# Patient Record
Sex: Female | Born: 1990 | Race: White | Hispanic: No | Marital: Single | Smoking: Current every day smoker
Health system: Southern US, Community
[De-identification: ages and names within clinical notes are randomized; demographics above are authoritative.]

---

## 2012-03-25 ENCOUNTER — Emergency Department (HOSPITAL_COMMUNITY)
Admission: EM | Admit: 2012-03-25 | Discharge: 2012-03-25 | Disposition: A | Discharge status: Home / Self Care | Payer: BC Managed Care – PPO | Source: Home / Self Care | Attending: Family Medicine | Admitting: Family Medicine

## 2012-03-25 ENCOUNTER — Encounter (HOSPITAL_COMMUNITY): Payer: Self-pay | Admitting: *Deleted

## 2012-03-25 DIAGNOSIS — B719 Cestode infection, unspecified: Secondary | ICD-10-CM

## 2012-03-25 MED ORDER — PRAZIQUANTEL 600 MG PO TABS: 1800.0000 mg | ORAL_TABLET | Freq: Two times a day (BID) | ORAL | Status: AC

## 2012-03-25 NOTE — ED Provider Notes (Signed)
History     CSN: [id number]  Arrival date & time 03/25/12  1117   First MD Initiated Contact with Patient 03/25/12 1120      Chief Complaint  Patient presents with  . Tinea    (Consider location/radiation/quality/duration/timing/severity/associated sxs/prior treatment) Patient is a 21 y.o. female presenting with cramps. The history is provided by the patient.  Abdominal Cramping The primary symptoms of the illness do not include fever. Primary symptoms comment: noticed a tapeworm in stool this am. The current episode started 3 to 5 hours ago. The onset of the illness was sudden. The problem has been gradually worsening.  Associated symptoms comments: Cramping in abd for past couple days, , new puppy also with worm infestation.Marland Kitchen    History reviewed. No pertinent past medical history.  History reviewed. No pertinent past surgical history.  Family History  Problem Relation Age of Onset  . Family history unknown: Yes    History  Substance Use Topics  . Smoking status: Current Every Day Smoker -- 1.0 packs/day    Types: Cigarettes  . Smokeless tobacco: Not on file  . Alcohol Use: Yes     socially    OB History    Grav Para Term Preterm Abortions TAB SAB Ect Mult Living                  Review of Systems  Constitutional: Negative for fever.  Gastrointestinal: Negative.     Allergies  Meloxicam and Penicillins  Home Medications   Current Outpatient Rx  Name Route Sig Dispense Refill  . PRAZIQUANTEL 600 MG PO TABS Oral Take 3 tablets (1,800 mg total) by mouth 2 (two) times daily with a meal. 6 tablet 0    BP 132/88  Pulse 90  Temp 98.1 F (36.7 C)  Resp 16  SpO2 98%  LMP 03/18/2012  Physical Exam  Nursing note and vitals reviewed. Constitutional: She is oriented to person, place, and time. She appears well-developed and well-nourished.  Abdominal: Soft. Bowel sounds are normal. She exhibits no distension and no mass. There is no tenderness. There is no  rebound and no guarding.  Neurological: She is alert and oriented to person, place, and time.  Skin: Skin is warm and dry.    ED Course  Procedures (including critical care time)  Labs Reviewed - No data to display No results found.   1. Tapeworm       MDM  Parasite lesion noted in stool spec obtained.        Linna Hoff, MD 03/25/12 [phone]

## 2012-03-25 NOTE — ED Notes (Signed)
Pt reports : " I shit out a tape worm this morning and I have been unsatisfied when eating, constipated, unusually loud stomach noises recently" pt reports recently getting new puppy and eating [place] food - no traveling

## 2012-03-29 LAB — OVA AND PARASITE EXAMINATION

## 2013-03-09 ENCOUNTER — Emergency Department (HOSPITAL_COMMUNITY)
Admission: EM | Admit: 2013-03-09 | Discharge: 2013-03-09 | Disposition: A | Discharge status: Home / Self Care | Payer: Self-pay

## 2018-04-14 ENCOUNTER — Other Ambulatory Visit (HOSPITAL_COMMUNITY): Payer: Self-pay | Admitting: Orthopedic Surgery

## 2018-04-14 DIAGNOSIS — M25562 Pain in left knee: Secondary | ICD-10-CM

## 2019-05-03 ENCOUNTER — Other Ambulatory Visit (HOSPITAL_COMMUNITY): Payer: Self-pay | Admitting: *Deleted

## 2019-05-03 DIAGNOSIS — N644 Mastodynia: Secondary | ICD-10-CM

## 2019-05-03 DIAGNOSIS — R2232 Localized swelling, mass and lump, left upper limb: Secondary | ICD-10-CM

## 2019-05-10 ENCOUNTER — Ambulatory Visit
Admission: RE | Admit: 2019-05-10 | Discharge: 2019-05-10 | Disposition: A | Discharge status: Home / Self Care | Payer: No Typology Code available for payment source | Source: Ambulatory Visit | Attending: Obstetrics and Gynecology | Admitting: Obstetrics and Gynecology

## 2019-05-10 ENCOUNTER — Other Ambulatory Visit: Payer: Self-pay

## 2019-05-10 ENCOUNTER — Ambulatory Visit (HOSPITAL_COMMUNITY)
Admission: RE | Admit: 2019-05-10 | Discharge: 2019-05-10 | Disposition: A | Discharge status: Home / Self Care | Payer: Self-pay | Source: Ambulatory Visit | Attending: Obstetrics and Gynecology | Admitting: Obstetrics and Gynecology

## 2019-05-10 ENCOUNTER — Encounter (HOSPITAL_COMMUNITY): Payer: Self-pay

## 2019-05-10 DIAGNOSIS — R2232 Localized swelling, mass and lump, left upper limb: Secondary | ICD-10-CM

## 2019-05-10 DIAGNOSIS — N644 Mastodynia: Secondary | ICD-10-CM

## 2019-05-10 DIAGNOSIS — Z1239 Encounter for other screening for malignant neoplasm of breast: Secondary | ICD-10-CM

## 2019-05-10 NOTE — Patient Instructions (Addendum)
Explained breast self awareness with [name]. Patient did not need a Pap smear today due to last Pap smear was in July 2020. Let her know [facility] will cover Pap smears every 3 years unless has a history of abnormal Pap smears. Referred patient to the [name] for a Left breast ultrasound. Appointment scheduled for Wednesday, May 10, 2019 at 1340. Patient aware of appointment and will be there. Discussed smoking cessation with patient. Referred to the [facility] Quitline and gave resources to the free smoking cessation classes at [facility]. Nina Erickson verbalized understanding.  Kareena Arrambide, Arvil Chaco, RN 10:49 AM

## 2019-05-10 NOTE — Progress Notes (Addendum)
Complaints of a left outer breat lump and pain when touched x 3 months. Patient rates the pain at a 3 out of 10.  Pap Smear: Pap smear not completed today. Last Pap smear was in July 2020 at [facility] and normal per patient. Per patient has no history of an abnormal Pap smear. No Pap smear results are in Epic.  Physical exam: Breasts Breasts symmetrical. No skin abnormalities bilateral breasts. No nipple retraction bilateral breasts. No nipple discharge bilateral breasts. No lymphadenopathy. No lumps palpated bilateral breasts. Unable to palpate a lump in patients area of concern within the left outer breast. Complaints of left outer breast pain on exam. Referred patient to the [facility] for a Left breast ultrasound. Appointment scheduled for Wednesday, May 10, 2019 at 1340.        Pelvic/Bimanual No Pap smear completed today since last Pap smear was in July 2020 per patient. Pap smear not indicated per [facility]CP guidelines.   Smoking History: Patient is a current smoker. Discussed smoking cessation with patient. Referred to the [facility] Quitline and gave resources to the free smoking cessation classes at [facility].  Patient Navigation: Patient education provided. Access to services provided for patient through [facility] program.   Breast and Cervical Cancer Risk Assessment: Patient has a family history of her maternal grandmother having breast cancer. Patient has no known genetic mutations or history of radiation treatment to the chest before age 29. Patient has no history of cervical dysplasia, immunocompromised, or DES exposure in-utero. Breast cancer risk assessment completed. No breast cancer risk calculated due to patient is less than 58 years old.

## 2019-05-10 NOTE — Addendum Note (Signed)
Encounter addended by: Loletta Parish, RN on: 05/10/2019 11:38 AM  Actions taken: Clinical Note Signed

## 2019-07-13 ENCOUNTER — Encounter (HOSPITAL_COMMUNITY): Payer: Self-pay

## 2020-10-31 ENCOUNTER — Other Ambulatory Visit: Payer: Self-pay

## 2020-10-31 ENCOUNTER — Emergency Department (HOSPITAL_COMMUNITY)
Admission: EM | Admit: 2020-10-31 | Discharge: 2020-10-31 | Disposition: A | Discharge status: Home / Self Care | Payer: Worker's Compensation | Attending: Emergency Medicine | Admitting: Emergency Medicine

## 2020-10-31 DIAGNOSIS — Y990 Civilian activity done for income or pay: Secondary | ICD-10-CM | POA: Diagnosis not present

## 2020-10-31 DIAGNOSIS — Y93E5 Activity, floor mopping and cleaning: Secondary | ICD-10-CM | POA: Insufficient documentation

## 2020-10-31 DIAGNOSIS — W25XXXA Contact with sharp glass, initial encounter: Secondary | ICD-10-CM | POA: Insufficient documentation

## 2020-10-31 DIAGNOSIS — S79921A Unspecified injury of right thigh, initial encounter: Secondary | ICD-10-CM | POA: Diagnosis present

## 2020-10-31 DIAGNOSIS — F17210 Nicotine dependence, cigarettes, uncomplicated: Secondary | ICD-10-CM | POA: Diagnosis not present

## 2020-10-31 DIAGNOSIS — S71111A Laceration without foreign body, right thigh, initial encounter: Secondary | ICD-10-CM | POA: Insufficient documentation

## 2020-10-31 MED ORDER — LIDOCAINE HCL (PF) 1 % IJ SOLN
5.0000 mL | Freq: Once | INTRAMUSCULAR | Status: AC
  Administered 2020-10-31: 5 mL
  Filled 2020-10-31: qty 30

## 2020-10-31 MED ORDER — LIDOCAINE HCL (PF) 1 % IJ SOLN: 30.0000 mL | Freq: Once | INTRAMUSCULAR | Status: DC

## 2020-10-31 NOTE — ED Provider Notes (Signed)
Nina Erickson Provider Note   CSN: [id number] Arrival date & time: 10/31/20  2041     History Chief Complaint  Patient presents with  . Laceration    Laceration to right thigh, bleeding controled     Nina Erickson is a 30 y.o. female.  The history is provided by the patient.  Laceration Location:  Leg Leg laceration location:  R upper leg Length:  4 cm Depth:  Cutaneous Quality: straight   Bleeding: controlled   Time since incident:  2 hours Laceration mechanism:  Broken glass Pain details:    Quality:  Aching   Severity:  Mild   Timing:  Constant   Progression:  Improving Foreign body present:  No foreign bodies Tetanus status:  Up to date Associated symptoms: no fever, no numbness, no redness, no swelling and no streaking        No past medical history on file.  Patient Active Problem List   Diagnosis Date Noted  . Screening breast examination 05/10/2019  . Breast pain, left 05/10/2019    No past surgical history on file.   OB History    Gravida  0   Para  0   Term  0   Preterm  0   AB  0   Living  0     SAB  0   IAB  0   Ectopic  0   Multiple  0   Live Births  0           Family History  Problem Relation Age of Onset  . Breast cancer Maternal Grandmother     Social History   Tobacco Use  . Smoking status: Current Every Day Smoker    Packs/day: 1.00    Types: Cigarettes  . Smokeless tobacco: Never Used  Vaping Use  . Vaping Use: Never used  Substance Use Topics  . Alcohol use: Yes    Comment: socially  . Drug use: No    Home Medications Prior to Admission medications   Medication Sig Start Date End Date Taking? Authorizing Provider  praziquantel (BILTRICIDE) 600 MG tablet Take 3 tablets (1,800 mg total) by mouth 2 (two) times daily with a meal. 03/25/12   Erickson, Nina Skye, MD    Allergies    Codeine, Meloxicam, and Penicillins  Review of Systems   Review of Systems   Constitutional: Negative for fever.  Skin: Positive for wound.    Physical Exam Updated Vital Signs BP 119/75 (BP Location: Right Arm)   Pulse 82   Temp 99.4 F (37.4 C) (Oral)   Resp 18   Ht 5\' 9"  (1.753 m)   Wt 77.1 kg   LMP 10/14/2020 (Approximate)   SpO2 100%   BMI 25.10 kg/m   Physical Exam Vitals and nursing note reviewed.  Constitutional:      General: She is not in acute distress.    Appearance: She is well-developed. She is not diaphoretic.  HENT:     Head: Normocephalic and atraumatic.  Eyes:     General: No scleral icterus.    Conjunctiva/sclera: Conjunctivae normal.  Cardiovascular:     Rate and Rhythm: Normal rate and regular rhythm.     Heart sounds: Normal heart sounds. No murmur heard. No friction rub. No gallop.   Pulmonary:     Effort: Pulmonary effort is normal. No respiratory distress.     Breath sounds: Normal breath sounds.  Abdominal:     General: Bowel sounds are  normal. There is no distension.     Palpations: Abdomen is soft. There is no mass.     Tenderness: There is no abdominal tenderness. There is no guarding.  Musculoskeletal:     Cervical back: Normal range of motion.  Skin:    General: Skin is warm and dry.     Comments: 4 cm laceration R thigh  Neurological:     Mental Status: She is alert and oriented to person, place, and time.  Psychiatric:        Behavior: Behavior normal.     ED Results / Procedures / Treatments   Labs (all labs ordered are listed, but only abnormal results are displayed) Labs Reviewed - No data to display  EKG None  Radiology No results found.  Procedures .Marland KitchenLaceration Repair  Date/Time: 10/31/2020 11:18 PM Performed by: Arthor Captain, PA-C Authorized by: Arthor Captain, PA-C   Consent:    Consent obtained:  Verbal   Consent given by:  Patient   Risks discussed:  Infection, need for additional repair, pain, poor cosmetic result and poor wound healing   Alternatives discussed:  No  treatment and delayed treatment Universal protocol:    Procedure explained and questions answered to patient or proxy's satisfaction: yes     Relevant documents present and verified: yes     Test results available: yes     Imaging studies available: yes     Required blood products, implants, devices, and special equipment available: yes     Site/side marked: yes     Immediately prior to procedure, a time out was called: yes     Patient identity confirmed:  Verbally with patient Anesthesia:    Anesthesia method:  Local infiltration   Local anesthetic:  Lidocaine 1% w/o epi Laceration details:    Location:  Leg   Leg location:  R upper leg   Length (cm):  4   Depth (mm):  3 Pre-procedure details:    Preparation:  Patient was prepped and draped in usual sterile fashion Exploration:    Wound exploration: wound explored through full range of motion and entire depth of wound visualized   Treatment:    Area cleansed with:  Povidone-iodine   Amount of cleaning:  Standard   Irrigation solution:  Sterile saline   Irrigation method:  Pressure wash Skin repair:    Repair method:  Sutures   Suture size:  5-0   Wound skin closure material used: vicryl rapide.   Suture technique:  Running locked   Number of sutures:  5 Approximation:    Approximation:  Close Repair type:    Repair type:  Simple Post-procedure details:    Dressing:  Sterile dressing   Procedure completion:  Tolerated well, no immediate complications     Medications Ordered in ED Medications  lidocaine (PF) (XYLOCAINE) 1 % injection 5 mL (has no administration in time range)    ED Course  I have reviewed the triage vital signs and the nursing notes.  Pertinent labs & imaging results that were available during my care of the patient were reviewed by me and considered in my medical decision making (see chart for details).    MDM Rules/Calculators/A&P                          Nina Erickson is a 30 y.o. female who  presents to ED for laceration of thigh (r). Wound thoroughly cleaned in ED today. Wound explored and bottom  of wound seen in a bloodless field. Laceration repaired as dictated above. Patient counseled on home wound care. Patient was urged to return to the Emergency Department for worsening pain, swelling, expanding erythema especially if it streaks away from the affected area, fever, or for any additional concerns. Patient verbalized understanding. All questions answered.  Final Clinical Impression(s) / ED Diagnoses Final diagnoses:  None    Rx / DC Orders ED Discharge Orders    None       Arthor Captain, PA-C 10/31/20 2320    Virgina Norfolk, DO 10/31/20 2327

## 2020-10-31 NOTE — ED Triage Notes (Signed)
30 yo female presents to ED status post lac to right thigh. Pt states while working she had a wine glass that she was cleaning that fell out of her hand. Pt states she attempted to catch glass from falling to the ground by pinning it between cabinet and her thigh causing a lac to her thigh by broken stem of wine glass. Pt states she cleaned the area with water. Pt denies any other complaints at this time.

## 2020-10-31 NOTE — Discharge Instructions (Addendum)
WOUND CARE If your stitches have not dissolved, please have your stitches/staples removed in 7 days or sooner if you have concerns. You may do this at any available urgent care or at your primary care doctor's office.  Keep area clean and dry for 24 hours. Do not remove bandage, if applied.  After 24 hours, remove bandage and wash wound gently with mild soap and warm water. Reapply a new bandage after cleaning wound, if directed.  Continue daily cleansing with soap and water until stitches/staples are removed.  Do not apply any ointments or creams to the wound while stitches/staples are in place, as this may cause delayed healing.  Seek medical careif you experience any of the following signs of infection: Swelling, redness, pus drainage, streaking, fever >101.0 F  Seek care if you experience excessive bleeding that does not stop after 15-20 minutes of constant, firm pressure.

## 2021-06-25 IMAGING — US US BREAST*L* LIMITED INC AXILLA
1 series · 6 of 6 positions shown · non-contrast
Comparison: Previous exam(s).

CLINICAL DATA: Patient complains focal pain in the upper-outer
quadrant of the left breast and left axilla.

EXAM:
ULTRASOUND OF THE LEFT BREAST

[Series 1: us breast*left* limited inc axilla · 0.06mm/px · 6 of 6 slices shown]
[im 1/6]
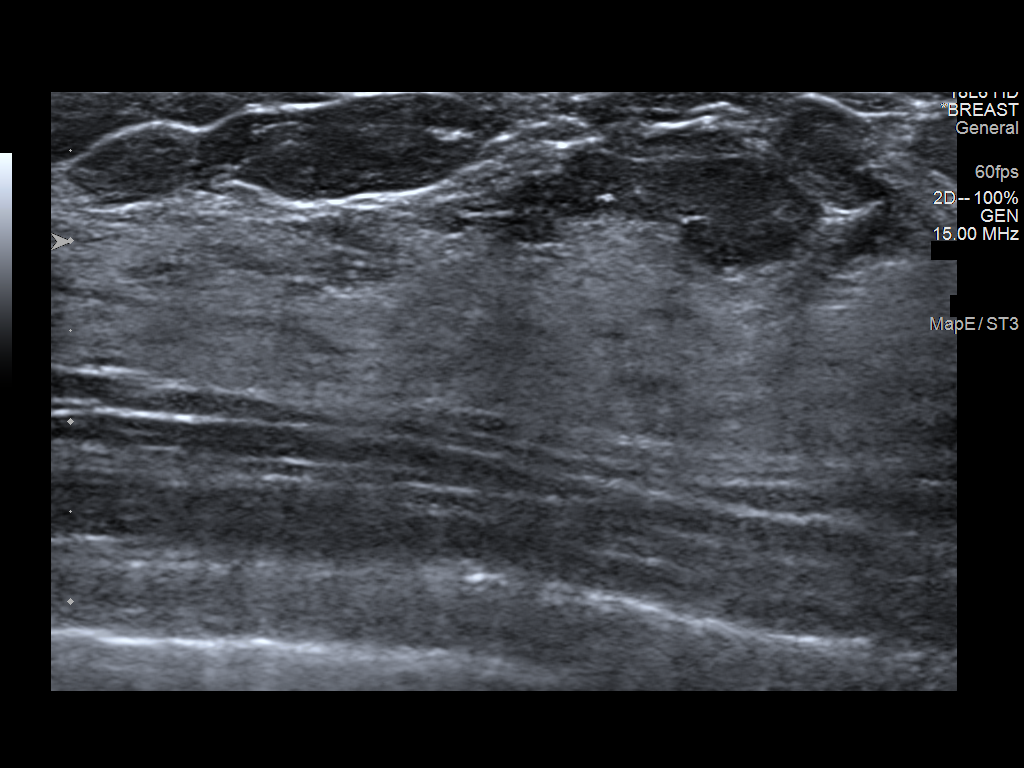
[im 2/6]
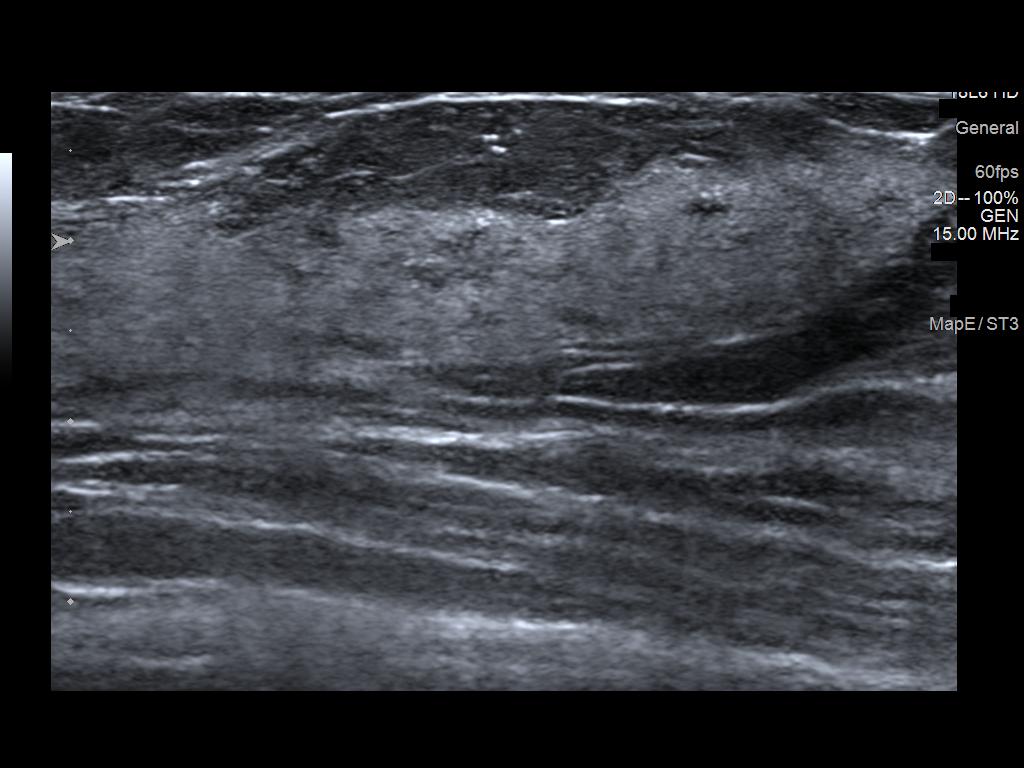
[im 3/6]
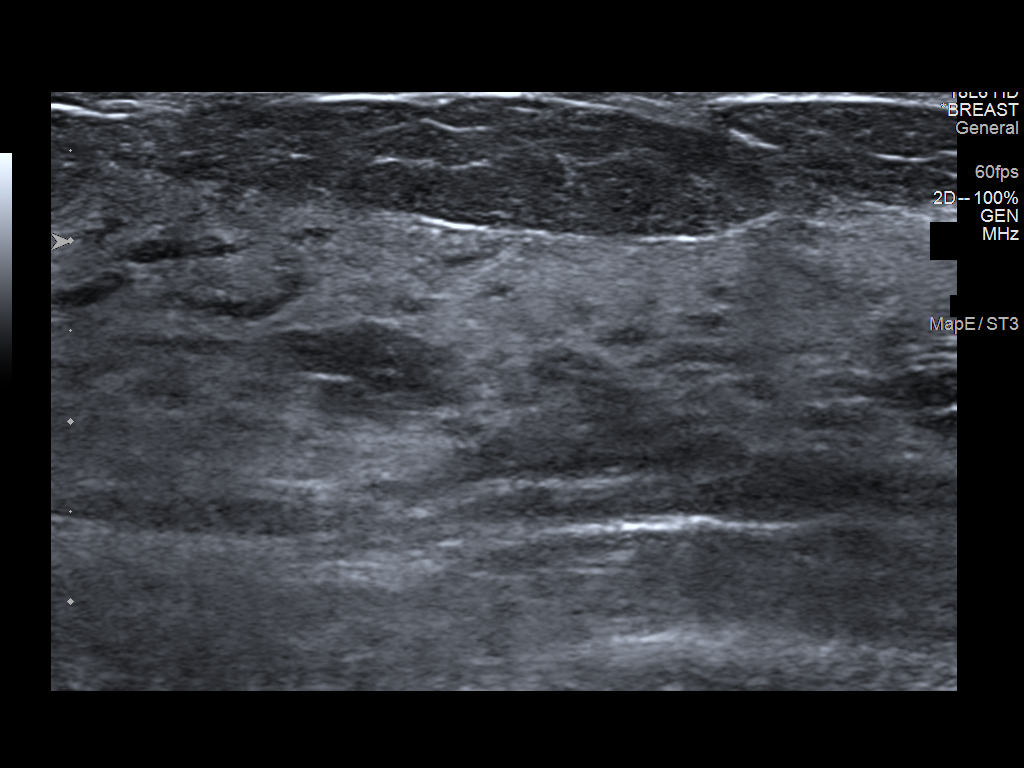
[im 4/6]
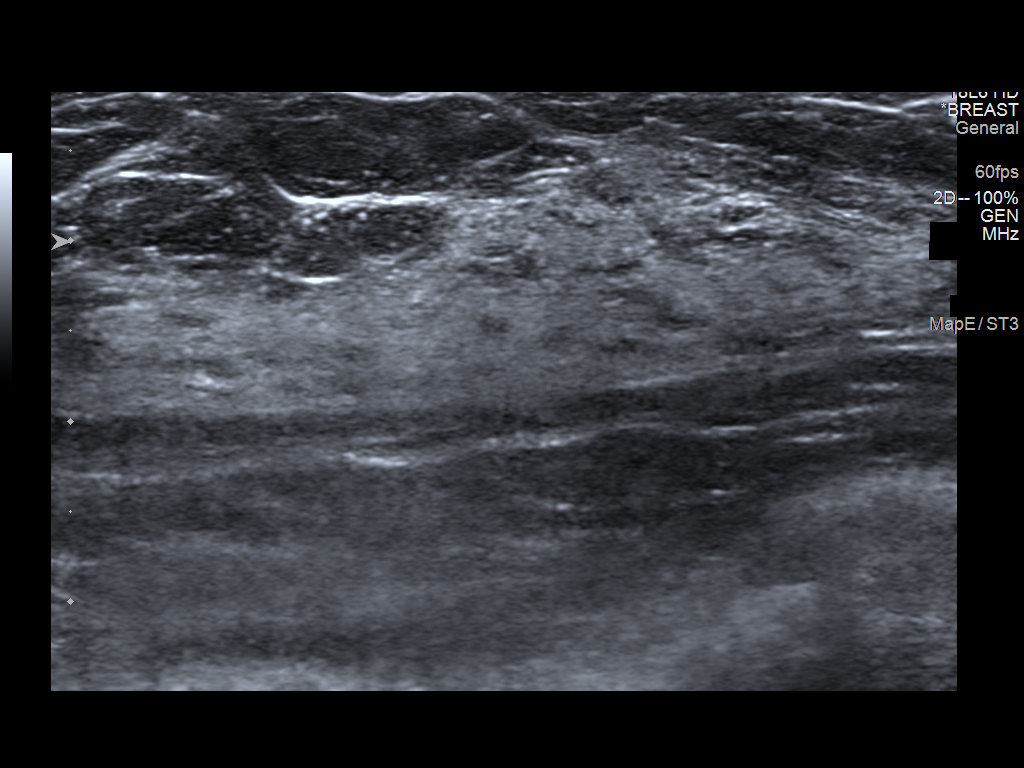
[im 5/6]
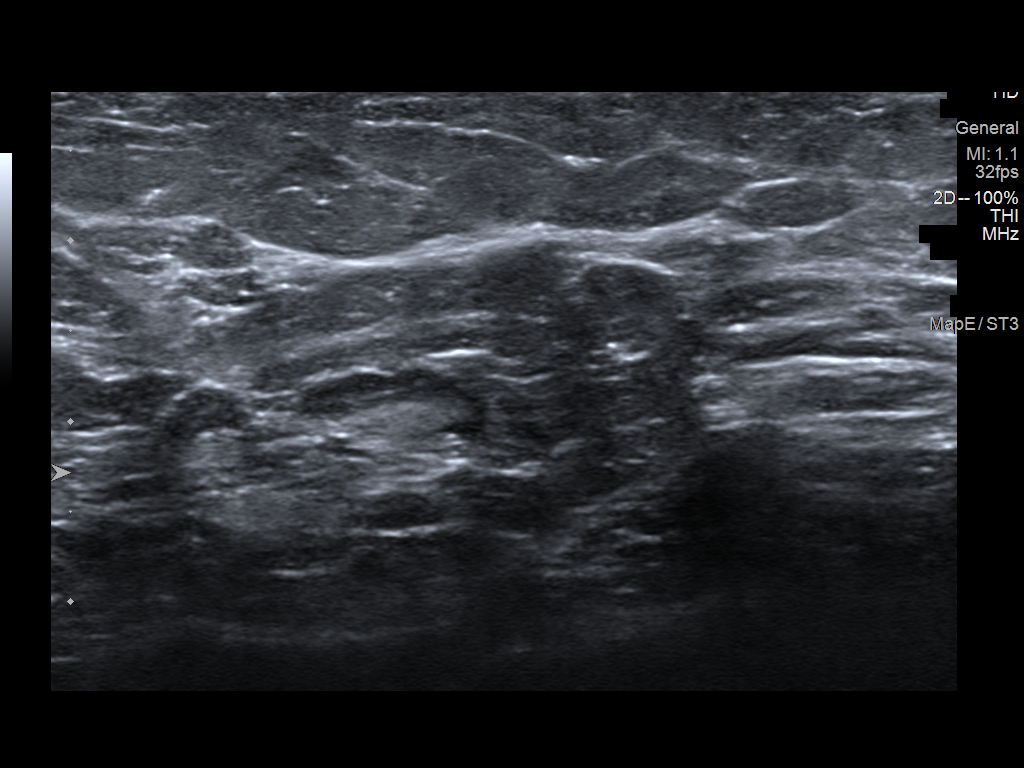
[im 6/6]
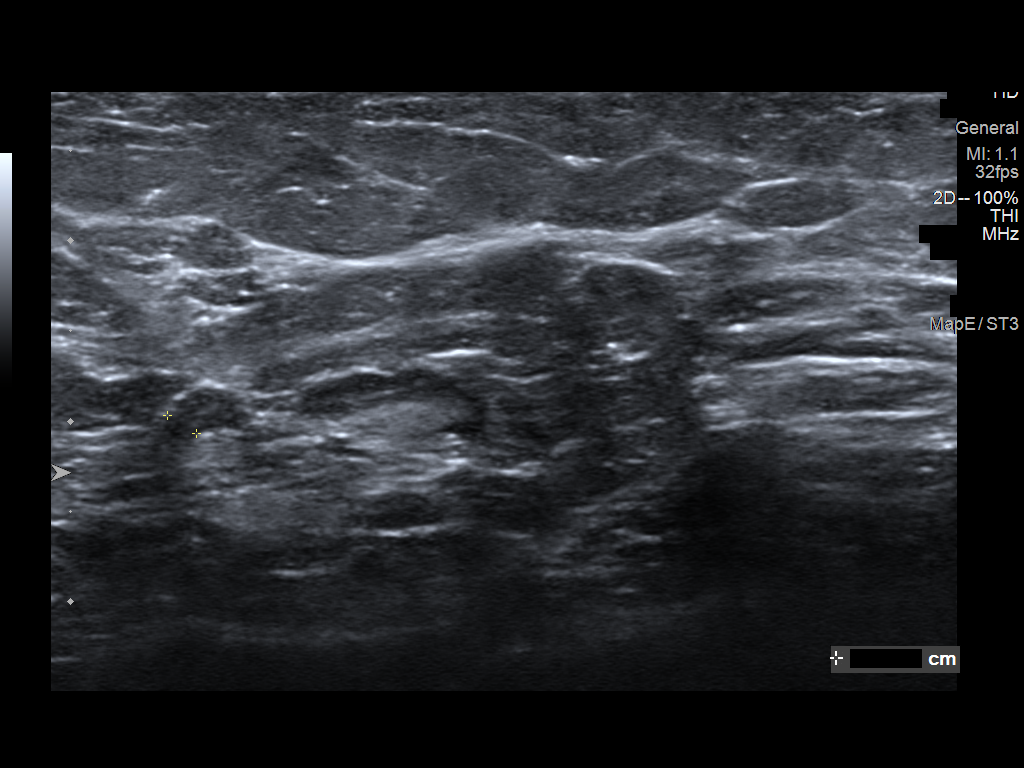

[6 of 6 positions shown; findings below may reference images not displayed]

FINDINGS: On physical exam, I do not palpate a mass in the upper-outer
quadrant of the left breast or the left axilla.

Targeted ultrasound is performed, showing normal tissue in the area
of clinical concern in the upper-outer quadrant of the left breast
as well as the left axilla. No suspicious mass, enlarged adenopathy,
distortion or abnormal shadowing detected.
IMPRESSION: No sonographic evidence of malignancy in the patient's area of
clinical concern in the upper-outer quadrant of the left breast.

RECOMMENDATION:
If the clinical exam remains benign/stable screening mammography can
be deferred until the age of 41.

I have discussed the findings and recommendations with the patient.
If applicable, a reminder letter will be sent to the patient
regarding the next appointment.

BI-RADS CATEGORY  1: Negative.

## 2022-05-29 ENCOUNTER — Ambulatory Visit: Payer: Self-pay

## 2022-05-29 ENCOUNTER — Other Ambulatory Visit: Payer: Self-pay | Admitting: Family Medicine

## 2022-05-29 DIAGNOSIS — R52 Pain, unspecified: Secondary | ICD-10-CM

## 2022-07-30 DIAGNOSIS — Z419 Encounter for procedure for purposes other than remedying health state, unspecified: Secondary | ICD-10-CM | POA: Diagnosis not present

## 2022-08-28 DIAGNOSIS — Z419 Encounter for procedure for purposes other than remedying health state, unspecified: Secondary | ICD-10-CM | POA: Diagnosis not present

## 2022-09-28 DIAGNOSIS — Z419 Encounter for procedure for purposes other than remedying health state, unspecified: Secondary | ICD-10-CM | POA: Diagnosis not present

## 2022-10-28 DIAGNOSIS — Z419 Encounter for procedure for purposes other than remedying health state, unspecified: Secondary | ICD-10-CM | POA: Diagnosis not present

## 2022-11-28 DIAGNOSIS — Z419 Encounter for procedure for purposes other than remedying health state, unspecified: Secondary | ICD-10-CM | POA: Diagnosis not present

## 2022-12-28 DIAGNOSIS — Z419 Encounter for procedure for purposes other than remedying health state, unspecified: Secondary | ICD-10-CM | POA: Diagnosis not present

## 2023-01-28 DIAGNOSIS — Z419 Encounter for procedure for purposes other than remedying health state, unspecified: Secondary | ICD-10-CM | POA: Diagnosis not present

## 2023-02-28 DIAGNOSIS — Z419 Encounter for procedure for purposes other than remedying health state, unspecified: Secondary | ICD-10-CM | POA: Diagnosis not present

## 2023-03-30 DIAGNOSIS — Z419 Encounter for procedure for purposes other than remedying health state, unspecified: Secondary | ICD-10-CM | POA: Diagnosis not present

## 2023-04-30 DIAGNOSIS — Z419 Encounter for procedure for purposes other than remedying health state, unspecified: Secondary | ICD-10-CM | POA: Diagnosis not present

## 2023-05-30 DIAGNOSIS — Z419 Encounter for procedure for purposes other than remedying health state, unspecified: Secondary | ICD-10-CM | POA: Diagnosis not present

## 2023-06-30 DIAGNOSIS — Z419 Encounter for procedure for purposes other than remedying health state, unspecified: Secondary | ICD-10-CM | POA: Diagnosis not present

## 2023-07-31 DIAGNOSIS — Z419 Encounter for procedure for purposes other than remedying health state, unspecified: Secondary | ICD-10-CM | POA: Diagnosis not present

## 2023-08-28 DIAGNOSIS — Z419 Encounter for procedure for purposes other than remedying health state, unspecified: Secondary | ICD-10-CM | POA: Diagnosis not present

## 2023-10-09 DIAGNOSIS — Z419 Encounter for procedure for purposes other than remedying health state, unspecified: Secondary | ICD-10-CM | POA: Diagnosis not present

## 2023-11-08 DIAGNOSIS — Z419 Encounter for procedure for purposes other than remedying health state, unspecified: Secondary | ICD-10-CM | POA: Diagnosis not present
